# Patient Record
Sex: Male | Born: 1970 | Race: Black or African American | Hispanic: No | Marital: Married | Smoking: Never smoker
Health system: Southern US, Community
[De-identification: ages and names within clinical notes are randomized; demographics above are authoritative.]

## PROBLEM LIST (undated history)

## (undated) DIAGNOSIS — E119 Type 2 diabetes mellitus without complications: Secondary | ICD-10-CM

## (undated) DIAGNOSIS — I10 Essential (primary) hypertension: Secondary | ICD-10-CM

## (undated) DIAGNOSIS — M1990 Unspecified osteoarthritis, unspecified site: Secondary | ICD-10-CM

## (undated) HISTORY — PX: OTHER SURGICAL HISTORY: SHX169

---

## 2013-06-08 NOTE — Patient Instructions (Signed)
SI JACHIM  06/08/2013   Your procedure is scheduled on:  06/12/13               Surgery 1230pm-130pm  Report to [facility] at      1000AM.  Call this number if you have problems the morning of surgery: [phone]   Remember:   Do not eat food or drink liquids after midnight.   Take these medicines the morning of surgery with A SIP OF WATER:    Do not wear jewelry  Do not wear lotions, powders, or perfumes.    Men may shave face and neck.  Do not bring valuables to the hospital.  Contacts, dentures or bridgework may not be worn into surgery.       Patients discharged the day of surgery will not be allowed to drive  home.  Name and phone number of your driver:    SEE CHG INSTRUCTION SHEET    Please read over the following fact sheets that you were given: MRSA Information, coughing and deep breathing exercises, leg exercises               Failure to comply with these instructions may result in cancellation of your surgery.                Patient Signature ____________________________              Nurse Signature _____________________________

## 2013-06-11 ENCOUNTER — Encounter (HOSPITAL_COMMUNITY)
Admission: RE | Admit: 2013-06-11 | Discharge: 2013-06-11 | Disposition: A | Discharge status: Home / Self Care | Payer: Self-pay | Source: Ambulatory Visit | Attending: Orthopedic Surgery | Admitting: Orthopedic Surgery

## 2013-06-11 ENCOUNTER — Ambulatory Visit (HOSPITAL_COMMUNITY)
Admission: RE | Admit: 2013-06-11 | Discharge: 2013-06-11 | Disposition: A | Discharge status: Home / Self Care | Payer: Self-pay | Source: Ambulatory Visit | Attending: Orthopedic Surgery | Admitting: Orthopedic Surgery

## 2013-06-11 ENCOUNTER — Encounter (HOSPITAL_COMMUNITY): Payer: Self-pay

## 2013-06-11 ENCOUNTER — Encounter (HOSPITAL_COMMUNITY): Payer: Self-pay | Admitting: Pharmacy Technician

## 2013-06-11 DIAGNOSIS — I10 Essential (primary) hypertension: Secondary | ICD-10-CM | POA: Insufficient documentation

## 2013-06-11 DIAGNOSIS — Z01818 Encounter for other preprocedural examination: Secondary | ICD-10-CM | POA: Insufficient documentation

## 2013-06-11 DIAGNOSIS — Z01812 Encounter for preprocedural laboratory examination: Secondary | ICD-10-CM | POA: Insufficient documentation

## 2013-06-11 DIAGNOSIS — X58XXXA Exposure to other specified factors, initial encounter: Secondary | ICD-10-CM | POA: Insufficient documentation

## 2013-06-11 DIAGNOSIS — Z01810 Encounter for preprocedural cardiovascular examination: Secondary | ICD-10-CM | POA: Insufficient documentation

## 2013-06-11 DIAGNOSIS — IMO0002 Reserved for concepts with insufficient information to code with codable children: Secondary | ICD-10-CM | POA: Insufficient documentation

## 2013-06-11 HISTORY — DX: Essential (primary) hypertension: I10

## 2013-06-11 HISTORY — DX: Type 2 diabetes mellitus without complications: E11.9

## 2013-06-11 HISTORY — DX: Unspecified osteoarthritis, unspecified site: M19.90

## 2013-06-11 LAB — CBC
HCT: 38.8 % — ABNORMAL LOW (ref 39.0–52.0)
MCH: 28.9 pg (ref 26.0–34.0)
MCV: 87 fL (ref 78.0–100.0)
Platelets: 378 10*3/uL (ref 150–400)
RDW: 12.5 % (ref 11.5–15.5)
WBC: 12.4 10*3/uL — ABNORMAL HIGH (ref 4.0–10.5)

## 2013-06-11 LAB — BASIC METABOLIC PANEL
BUN: 13 mg/dL (ref 6–23)
CO2: 31 mEq/L (ref 19–32)
Calcium: 10.1 mg/dL (ref 8.4–10.5)
Chloride: 95 mEq/L — ABNORMAL LOW (ref 96–112)
Creatinine, Ser: 0.87 mg/dL (ref 0.50–1.35)
GFR calc Af Amer: >90 mL/min (ref >90)

## 2013-06-11 NOTE — Progress Notes (Signed)
BMP results faxed via EPIC to Dr Darrelyn Hillock.

## 2013-06-11 NOTE — Progress Notes (Signed)
CBC results faxed via EPIC to Dr Gioffre.  

## 2013-06-12 ENCOUNTER — Ambulatory Visit (HOSPITAL_COMMUNITY): Payer: Self-pay | Admitting: Anesthesiology

## 2013-06-12 ENCOUNTER — Encounter (HOSPITAL_COMMUNITY): Payer: Self-pay | Admitting: Anesthesiology

## 2013-06-12 ENCOUNTER — Encounter (HOSPITAL_COMMUNITY)
Admission: RE | Disposition: A | Discharge status: Home / Self Care | Payer: Self-pay | Source: Ambulatory Visit | Attending: Orthopedic Surgery

## 2013-06-12 ENCOUNTER — Encounter (HOSPITAL_COMMUNITY): Payer: Self-pay | Admitting: *Deleted

## 2013-06-12 ENCOUNTER — Ambulatory Visit (HOSPITAL_COMMUNITY)
Admission: RE | Admit: 2013-06-12 | Discharge: 2013-06-12 | Disposition: A | Discharge status: Home / Self Care | Payer: Self-pay | Source: Ambulatory Visit | Attending: Orthopedic Surgery | Admitting: Orthopedic Surgery

## 2013-06-12 DIAGNOSIS — I10 Essential (primary) hypertension: Secondary | ICD-10-CM | POA: Insufficient documentation

## 2013-06-12 DIAGNOSIS — Z79899 Other long term (current) drug therapy: Secondary | ICD-10-CM | POA: Insufficient documentation

## 2013-06-12 DIAGNOSIS — IMO0002 Reserved for concepts with insufficient information to code with codable children: Secondary | ICD-10-CM | POA: Insufficient documentation

## 2013-06-12 DIAGNOSIS — X58XXXA Exposure to other specified factors, initial encounter: Secondary | ICD-10-CM | POA: Insufficient documentation

## 2013-06-12 DIAGNOSIS — M6580 Other synovitis and tenosynovitis, unspecified site: Secondary | ICD-10-CM | POA: Insufficient documentation

## 2013-06-12 DIAGNOSIS — E119 Type 2 diabetes mellitus without complications: Secondary | ICD-10-CM | POA: Insufficient documentation

## 2013-06-12 DIAGNOSIS — M1710 Unilateral primary osteoarthritis, unspecified knee: Secondary | ICD-10-CM | POA: Insufficient documentation

## 2013-06-12 DIAGNOSIS — S83249A Other tear of medial meniscus, current injury, unspecified knee, initial encounter: Secondary | ICD-10-CM

## 2013-06-12 DIAGNOSIS — S83242A Other tear of medial meniscus, current injury, left knee, initial encounter: Secondary | ICD-10-CM

## 2013-06-12 HISTORY — PX: KNEE ARTHROSCOPY WITH MEDIAL MENISECTOMY: SHX5651

## 2013-06-12 LAB — GLUCOSE, CAPILLARY
Glucose-Capillary: 110 mg/dL — ABNORMAL HIGH (ref 70–99)
Glucose-Capillary: 105 mg/dL — ABNORMAL HIGH (ref 70–99)

## 2013-06-12 SURGERY — ARTHROSCOPY, KNEE, WITH MEDIAL MENISCECTOMY
Anesthesia: General | Site: Knee | Laterality: Left | Wound class: Clean

## 2013-06-12 MED ORDER — OXYCODONE-ACETAMINOPHEN 5-325 MG PO TABS
1.0000 | ORAL_TABLET | ORAL | Status: DC | PRN
  Administered 2013-06-12: 1 via ORAL
  Filled 2013-06-12: qty 1

## 2013-06-12 MED ORDER — FENTANYL CITRATE 0.05 MG/ML IJ SOLN
INTRAMUSCULAR | Status: DC | PRN
  Administered 2013-06-12 (×6): 25 ug via INTRAVENOUS

## 2013-06-12 MED ORDER — BACITRACIN ZINC 500 UNIT/GM EX OINT
TOPICAL_OINTMENT | CUTANEOUS | Status: DC | PRN
  Administered 2013-06-12: 1 via TOPICAL

## 2013-06-12 MED ORDER — MEPERIDINE HCL 50 MG/ML IJ SOLN: 6.2500 mg | INTRAMUSCULAR | Status: DC | PRN

## 2013-06-12 MED ORDER — FENTANYL CITRATE 0.05 MG/ML IJ SOLN: 25.0000 ug | INTRAMUSCULAR | Status: DC | PRN

## 2013-06-12 MED ORDER — BACITRACIN ZINC 500 UNIT/GM EX OINT
TOPICAL_OINTMENT | CUTANEOUS | Status: AC
  Filled 2013-06-12: qty 28.35

## 2013-06-12 MED ORDER — MIDAZOLAM HCL 5 MG/5ML IJ SOLN
INTRAMUSCULAR | Status: DC | PRN
  Administered 2013-06-12: 2 mg via INTRAVENOUS

## 2013-06-12 MED ORDER — BUPIVACAINE-EPINEPHRINE 0.25% -1:200000 IJ SOLN
INTRAMUSCULAR | Status: DC | PRN
  Administered 2013-06-12: 30 mL

## 2013-06-12 MED ORDER — BUPIVACAINE-EPINEPHRINE PF 0.25-1:200000 % IJ SOLN
INTRAMUSCULAR | Status: AC
  Filled 2013-06-12: qty 30

## 2013-06-12 MED ORDER — LACTATED RINGERS IR SOLN
Status: DC | PRN
  Administered 2013-06-12 (×3): 3000 mL

## 2013-06-12 MED ORDER — OXYCODONE-ACETAMINOPHEN 10-325 MG PO TABS: 1.0000 | ORAL_TABLET | ORAL | Status: AC | PRN

## 2013-06-12 MED ORDER — ONDANSETRON HCL 4 MG/2ML IJ SOLN
INTRAMUSCULAR | Status: DC | PRN
  Administered 2013-06-12: 4 mg via INTRAVENOUS

## 2013-06-12 MED ORDER — LACTATED RINGERS IV SOLN
INTRAVENOUS | Status: DC
  Administered 2013-06-12: 1000 mL via INTRAVENOUS

## 2013-06-12 MED ORDER — OXYCODONE HCL 5 MG PO TABS
5.0000 mg | ORAL_TABLET | ORAL | Status: DC | PRN
  Filled 2013-06-12: qty 1

## 2013-06-12 MED ORDER — OXYCODONE HCL 5 MG PO TABS
5.0000 mg | ORAL_TABLET | ORAL | Status: DC | PRN
  Administered 2013-06-12: 5 mg via ORAL

## 2013-06-12 MED ORDER — LACTATED RINGERS IV SOLN: INTRAVENOUS | Status: DC

## 2013-06-12 MED ORDER — ACETAMINOPHEN 325 MG PO TABS
325.0000 mg | ORAL_TABLET | ORAL | Status: DC | PRN
  Filled 2013-06-12: qty 1

## 2013-06-12 MED ORDER — PROPOFOL 10 MG/ML IV BOLUS
INTRAVENOUS | Status: DC | PRN
  Administered 2013-06-12: 200 mg via INTRAVENOUS
  Administered 2013-06-12: 50 mg via INTRAVENOUS

## 2013-06-12 MED ORDER — PROMETHAZINE HCL 25 MG/ML IJ SOLN: 6.2500 mg | INTRAMUSCULAR | Status: DC | PRN

## 2013-06-12 MED ORDER — CEFAZOLIN SODIUM-DEXTROSE 2-3 GM-% IV SOLR
2.0000 g | INTRAVENOUS | Status: AC
  Administered 2013-06-12: 2 g via INTRAVENOUS

## 2013-06-12 MED ORDER — CEFAZOLIN SODIUM-DEXTROSE 2-3 GM-% IV SOLR
INTRAVENOUS | Status: AC
  Filled 2013-06-12: qty 50

## 2013-06-12 MED ORDER — CHLORHEXIDINE GLUCONATE 4 % EX LIQD
60.0000 mL | Freq: Once | CUTANEOUS | Status: DC
  Filled 2013-06-12: qty 60

## 2013-06-12 SURGICAL SUPPLY — 29 items
BANDAGE ELASTIC 4 VELCRO ST LF (GAUZE/BANDAGES/DRESSINGS) ×2 IMPLANT
BANDAGE ELASTIC 6 VELCRO ST LF (GAUZE/BANDAGES/DRESSINGS) ×4 IMPLANT
BANDAGE GAUZE ELAST BULKY 4 IN (GAUZE/BANDAGES/DRESSINGS) ×2 IMPLANT
BLADE GREAT WHITE 4.2 (BLADE) ×2 IMPLANT
BNDG COHESIVE 6X5 TAN STRL LF (GAUZE/BANDAGES/DRESSINGS) ×2 IMPLANT
CLOTH BEACON ORANGE TIMEOUT ST (SAFETY) ×2 IMPLANT
DRAPE LG THREE QUARTER DISP (DRAPES) ×2 IMPLANT
DRSG ADAPTIC 3X8 NADH LF (GAUZE/BANDAGES/DRESSINGS) ×2 IMPLANT
DRSG EMULSION OIL 3X3 NADH (GAUZE/BANDAGES/DRESSINGS) ×2 IMPLANT
DRSG PAD ABDOMINAL 8X10 ST (GAUZE/BANDAGES/DRESSINGS) ×6 IMPLANT
DURAPREP 26ML APPLICATOR (WOUND CARE) ×2 IMPLANT
GLOVE BIOGEL PI IND STRL 8 (GLOVE) ×1 IMPLANT
GLOVE BIOGEL PI INDICATOR 8 (GLOVE) ×1
GLOVE ECLIPSE 8.0 STRL XLNG CF (GLOVE) ×2 IMPLANT
GOWN PREVENTION PLUS LG XLONG (DISPOSABLE) ×2 IMPLANT
GOWN STRL REIN XL XLG (GOWN DISPOSABLE) ×2 IMPLANT
MANIFOLD NEPTUNE II (INSTRUMENTS) ×2 IMPLANT
PACK ARTHROSCOPY WL (CUSTOM PROCEDURE TRAY) ×2 IMPLANT
PACK ICE MAXI GEL EZY WRAP (MISCELLANEOUS) ×2 IMPLANT
PAD MASON LEG HOLDER (PIN) ×2 IMPLANT
SET ARTHROSCOPY TUBING (MISCELLANEOUS) ×1
SET ARTHROSCOPY TUBING LN (MISCELLANEOUS) ×1 IMPLANT
SPONGE GAUZE 4X4 12PLY (GAUZE/BANDAGES/DRESSINGS) ×2 IMPLANT
SUT ETHILON 3 0 PS 1 (SUTURE) ×2 IMPLANT
SYR 20CC LL (SYRINGE) ×4 IMPLANT
TOWEL OR 17X26 10 PK STRL BLUE (TOWEL DISPOSABLE) ×2 IMPLANT
TUBING CONNECTING 10 (TUBING) ×2 IMPLANT
WAND 90 DEG TURBOVAC W/CORD (SURGICAL WAND) ×2 IMPLANT
WRAP KNEE MAXI GEL POST OP (GAUZE/BANDAGES/DRESSINGS) ×2 IMPLANT

## 2013-06-12 NOTE — Brief Op Note (Signed)
06/12/2013  1:50 PM  PATIENT:  Douglas Simmons  42 y.o. male  PRE-OPERATIVE DIAGNOSIS:  LEFT KNEE MEDIA MENISCAL TEAR  POST-OPERATIVE DIAGNOSIS:  LEFT KNEE MEDIA MENISCAL TEAR  PROCEDURE:  Procedure(s): LEFT KNEE ARTHROSCOPY WITH MEDIAL MENISECTOMY (Left) and Synovectomy of Suprapatellar Pouch and Abraision Chondroplasty of Medial Femoral Condyle.  SURGEON:  Surgeon(s) and Role:    * Jacki Cones, MD - Primary    ASSISTANTS:OR Tech   ANESTHESIA:   general  EBL:     BLOOD ADMINISTERED:none  DRAINS: none   LOCAL MEDICATIONS USED:  MARCAINE 30cc of 0.25% with Epinephrine.   SPECIMEN:  No Specimen  DISPOSITION OF SPECIMEN:  N/A  COUNTS:  YES  TOURNIQUET:  * No tourniquets in log *  DICTATION: .Other Dictation: Dictation Number [id number]  PLAN OF CARE: Discharge to home after PACU  PATIENT DISPOSITION:  PACU - hemodynamically stable.   Delay start of Pharmacological VTE agent (>24hrs) due to surgical blood loss or risk of bleeding: yes

## 2013-06-12 NOTE — Transfer of Care (Signed)
Immediate Anesthesia Transfer of Care Note  Patient: [name]  Procedure(s) Performed: Procedure(s) (LRB): LEFT KNEE ARTHROSCOPY WITH MEDIAL MENISECTOMY (Left)  Patient Location: PACU  Anesthesia Type: General  Level of Consciousness: sedated, patient cooperative and responds to stimulation  Airway & Oxygen Therapy: Patient Spontanous Breathing and Patient connected to face mask oxgen  Post-op Assessment: Report given to PACU RN and Post -op Vital signs reviewed and stable  Post vital signs: Reviewed and stable  Complications: No apparent anesthesia complications

## 2013-06-12 NOTE — Anesthesia Preprocedure Evaluation (Addendum)
Anesthesia Evaluation  Patient identified by MRN, date of birth, ID band Patient awake    Reviewed: Allergy & Precautions, H&P , NPO status , Patient's Chart, lab work & pertinent test results  Airway Mallampati: II TM Distance: >3 FB Neck ROM: full    Dental no notable dental hx.    Pulmonary neg pulmonary ROS,  breath sounds clear to auscultation  Pulmonary exam normal       Cardiovascular Exercise Tolerance: Good hypertension, On Medications Rhythm:regular Rate:Normal     Neuro/Psych negative neurological ROS  negative psych ROS   GI/Hepatic negative GI ROS, Neg liver ROS,   Endo/Other  diabetes, Well Controlled, Type 2, Oral Hypoglycemic Agents  Renal/GU negative Renal ROS  negative genitourinary   Musculoskeletal   Abdominal   Peds  Hematology negative hematology ROS (+)   Anesthesia Other Findings   Reproductive/Obstetrics negative OB ROS                           Anesthesia Physical Anesthesia Plan  ASA: III  Anesthesia Plan: General   Post-op Pain Management:    Induction: Intravenous  Airway Management Planned: LMA  Additional Equipment:   Intra-op Plan:   Post-operative Plan:   Informed Consent: I have reviewed the patients History and Physical, chart, labs and discussed the procedure including the risks, benefits and alternatives for the proposed anesthesia with the patient or authorized representative who has indicated his/her understanding and acceptance.   Dental Advisory Given  Plan Discussed with: CRNA and Surgeon  Anesthesia Plan Comments:         Anesthesia Quick Evaluation

## 2013-06-12 NOTE — Interval H&P Note (Signed)
History and Physical Interval Note:  06/12/2013 12:46 PM  Douglas Simmons  has presented today for surgery, with the diagnosis of LEFT KNEE MEDIA MENISCAL TEAR  The various methods of treatment have been discussed with the patient and family. After consideration of risks, benefits and other options for treatment, the patient has consented to  Procedure(s): LEFT KNEE ARTHROSCOPY WITH MEDIAL MENISECTOMY (Left) as a surgical intervention .  The patient's history has been reviewed, patient examined, no change in status, stable for surgery.  I have reviewed the patient's chart and labs.  Questions were answered to the patient's satisfaction.     Braedyn Kauk A

## 2013-06-12 NOTE — Progress Notes (Signed)
Patient and wife verbalized understanding of discharge instructions. Ortho Tech demonstrated use of crutches to patient. Patient utilized crutches to restroom prior to discharge. Patient was given prescriptions. Patient was accompanied by nursing staff to lobby. Patient is stable at discharge.

## 2013-06-12 NOTE — Anesthesia Postprocedure Evaluation (Signed)
  Anesthesia Post-op Note  Patient: Douglas Simmons  Procedure(s) Performed: Procedure(s) (LRB): LEFT KNEE ARTHROSCOPY WITH MEDIAL MENISECTOMY (Left)  Patient Location: PACU  Anesthesia Type: General  Level of Consciousness: awake and alert   Airway and Oxygen Therapy: Patient Spontanous Breathing  Post-op Pain: mild  Post-op Assessment: Post-op Vital signs reviewed, Patient's Cardiovascular Status Stable, Respiratory Function Stable, Patent Airway and No signs of Nausea or vomiting  Last Vitals:  Filed Vitals:   06/12/13 1454  BP: 140/93  Pulse: 70  Temp: 36.7 C  Resp: 14    Post-op Vital Signs: stable   Complications: No apparent anesthesia complications

## 2013-06-12 NOTE — H&P (Signed)
Douglas Simmons is an 42 y.o. male.   Chief Complaint: left knee pain HPI: The patient is a 42 year old male who presented with the chief complaint of left knee pain. He did not recall a specific fall but felt that he had injured his knee while moving some furniture 2 months ago. He has had pain and swelling, as well as mechanical symptoms including locking and catching. MRI of the left knee showed tricompartmental arthritis as well as a torn medial meniscus.  Past Medical History  Diagnosis Date  . Hypertension   . Diabetes mellitus without complication   . Arthritis     Past Surgical History  Procedure Laterality Date  . Right knee arthroscopy       Social History:  reports that he has never smoked. He has never used smokeless tobacco. He reports that he does not drink alcohol or use illicit drugs.  Allergies: No Known Allergies  Current outpatient prescriptions: amLODipine (NORVASC) 10 MG tablet, Take 10 mg by mouth daily., Disp: , Rfl: ;   candesartan (ATACAND) 32 MG tablet, Take 32 mg by mouth daily., Disp: , Rfl: ;   glipiZIDE (GLUCOTROL) 5 MG tablet, Take 5 mg by mouth 2 (two) times daily before a meal., Disp: , Rfl: ;   hydrochlorothiazide (HYDRODIURIL) 25 MG tablet, Take 25 mg by mouth daily., Disp: , Rfl:  metFORMIN (GLUCOPHAGE) 500 MG tablet, Take 1,000 mg by mouth 2 (two) times daily with a meal., Disp: , Rfl: ;   simvastatin (ZOCOR) 10 MG tablet, Take 10 mg by mouth at bedtime., Disp: , Rfl:   Results for orders placed during the hospital encounter of 06/11/13 (from the past 48 hour(s))  CBC     Status: Abnormal   Collection Time    06/11/13  8:20 AM      Result Value Range   WBC 12.4 (*) 4.0 - 10.5 K/uL   RBC 4.46  4.22 - 5.81 MIL/uL   Hemoglobin 12.9 (*) 13.0 - 17.0 g/dL   HCT 11.9 (*) 14.7 - 82.9 %   MCV 87.0  78.0 - 100.0 fL   MCH 28.9  26.0 - 34.0 pg   MCHC 33.2  30.0 - 36.0 g/dL   RDW 56.2  13.0 - 86.5 %   Platelets 378  150 - 400 K/uL  BASIC METABOLIC  PANEL     Status: Abnormal   Collection Time    06/11/13  8:20 AM      Result Value Range   Sodium 137  135 - 145 mEq/L   Potassium 3.2 (*) 3.5 - 5.1 mEq/L   Chloride 95 (*) 96 - 112 mEq/L   CO2 31  19 - 32 mEq/L   Glucose, Bld 134 (*) 70 - 99 mg/dL   BUN 13  6 - 23 mg/dL   Creatinine, Ser 7.84  0.50 - 1.35 mg/dL   Calcium 69.6  8.4 - 29.5 mg/dL   GFR calc non Af Amer >90  >90 mL/min   GFR calc Af Amer >90  >90 mL/min   Comment: (NOTE)     The eGFR has been calculated using the CKD EPI equation.     This calculation has not been validated in all clinical situations.     eGFR's persistently <90 mL/min signify possible Chronic Kidney     Disease.   Dg Chest 2 View  06/11/2013   CLINICAL DATA:  Preop for left knee surgery  EXAM: CHEST  2 VIEW  COMPARISON:  None.  FINDINGS: The heart size and mediastinal contours are within normal limits. No acute infiltrate or pulmonary edema. The visualized skeletal structures are unremarkable.  IMPRESSION: No active cardiopulmonary disease.   Electronically Signed   By: Natasha Mead   On: 06/11/2013 08:40    Review of Systems  Constitutional: Negative.   HENT: Negative.  Negative for neck pain.   Eyes: Negative.   Respiratory: Negative.   Cardiovascular: Negative.   Gastrointestinal: Negative.   Genitourinary: Positive for frequency. Negative for dysuria, urgency, hematuria and flank pain.  Musculoskeletal: Positive for joint pain. Negative for myalgias, back pain and falls.  Skin: Negative.   Neurological: Negative.   Endo/Heme/Allergies: Negative.   Psychiatric/Behavioral: Negative.    Vitals Weight: 250 lb Height: 72 in Body Surface Area: 2.4 m Body Mass Index: 33.91 kg/m BP: 134/91 (Sitting, Left Arm, Standard)  HR: 76  Physical Exam  Constitutional: He is oriented to person, place, and time. He appears well-developed and well-nourished. No distress.  HENT:  Head: Normocephalic and atraumatic.  Right Ear: External ear  normal.  Left Ear: External ear normal.  Nose: Nose normal.  Mouth/Throat: Oropharynx is clear and moist.  Eyes: Conjunctivae and EOM are normal.  Neck: Normal range of motion. Neck supple.  Cardiovascular: Normal rate, regular rhythm, normal heart sounds and intact distal pulses.   No murmur heard. Respiratory: Effort normal and breath sounds normal. No respiratory distress. He has no wheezes.  GI: Soft. Bowel sounds are normal. He exhibits no distension. There is no tenderness.  Musculoskeletal:       Right hip: Normal.       Left hip: Normal.       Right knee: Normal.       Left knee: He exhibits decreased range of motion, swelling, effusion and abnormal meniscus. He exhibits no erythema. Tenderness found. Medial joint line tenderness noted. No lateral joint line tenderness noted.       Right lower leg: He exhibits no tenderness and no swelling.       Left lower leg: He exhibits no tenderness and no swelling.  Neurological: He is alert and oriented to person, place, and time. He has normal strength and normal reflexes. No sensory deficit.  Skin: No rash noted. He is not diaphoretic. No erythema.  Psychiatric: He has a normal mood and affect. His behavior is normal.     Assessment/Plan Medial meniscus tear, left knee He has a medial meniscus tear in his left knee. He needs a left knee arthroscopy with medial menisectomy. This will be an outpatient procedure. Risks and benefits of the surgery were discussed with the patient by Dr. Darrelyn Hillock.   Elijan Googe LAUREN 06/12/2013, 8:31 AM

## 2013-06-13 ENCOUNTER — Encounter (HOSPITAL_COMMUNITY): Payer: Self-pay | Admitting: Orthopedic Surgery

## 2013-06-13 NOTE — Op Note (Signed)
NAMESAFI, CULOTTA NO.:  [id number]  MEDICAL RECORD NO.:  [id number]  LOCATION:  [facility]                         FACILITY:  [facility]  PHYSICIAN:  Georges Lynch. Catalena Stanhope, M.D.DATE OF BIRTH:  Jul 03, 1971  DATE OF PROCEDURE:  06/12/2013 DATE OF DISCHARGE:  06/12/2013                              OPERATIVE REPORT   SURGEON:  Windy Fast A. [place] Inthavong, MD  OPERATIVE ASSISTANT:  The OR tech.  PREOPERATIVE DIAGNOSES: 1. Complex tear of the posterior horn medial meniscus of the left     knee. 2. Chronic synovitis, left knee.  POSTOPERATIVE DIAGNOSES: 1. Complex tear of the posterior horn medial meniscus of the left     knee. 2.  Chronic synovitis, left knee. 2. Early arthritis, left knee.  DESCRIPTION OF PROCEDURE:  Under general anesthesia, a routine orthopedic prepping and draping of the left lower extremity was carried out.  The left leg was placed in a knee holder.  The appropriate time- out was first carried out.  I also marked the appropriate left leg in the holding area.  At this time, a small punctate incision was made in the suprapatellar pouch after 2 g of IV Ancef was given.  I then inserted the inflow cannula.  The knee was distended with saline. Another small punctate incision was made in the anterolateral joint. The arthroscope was entered from lateral approach and a complete diagnostic arthroscopy was carried out.  I went up in the suprapatellar pouch.  He had a moderate synovitis.  I introduced the ArthroCare through the medial approach, did a partial synovectomy.  The patellofemoral joint looked fine.  I went down to the lateral joint. The lateral meniscus was intact.  The lateral joint looked fine. Cruciates were intact.  I went over the medial joint, highlighted main problem was the early chondromalacia of the medial joint.  I did a very minimal abrasion chondroplasty of the medial femoral condyle and we did not do this to bleeding bone.  I went back  posterior.  He had a large complex tear of the posterior horn of the medial meniscus.  I introduced the ArthroCare and did a partial medial meniscectomy.  Remaining part of the meniscus was stable.  I thoroughly irrigated out the knee, removed the fluid.  I closed all 3 punctate incisions with 3-0 nylon suture.  I injected 30 mL of  0.25% Marcaine with epinephrine in the knee joint and a sterile Neosporin dressing was applied.  The patient left the operating room in satisfactory condition.  POSTOPERATIVE CARE: 1. The patient will be on aspirin 325 mg b.i.d. as an anticoagulant,     starting today, will be on Percocet 10/350 one     every 3 to 4 hours p.r.n. for pain. 2. He will be on crutches partial-to-full weightbearing as tolerated. 3. He will see me in the office in 10 to 12 days or prior to that if     there is any problem.          ______________________________ Georges Lynch Darrelyn Hillock, M.D.     RAG/MEDQ  D:  06/12/2013  T:  06/13/2013  Job:  [id number]

## 2017-11-27 ENCOUNTER — Emergency Department (HOSPITAL_COMMUNITY): Payer: Self-pay

## 2017-11-27 ENCOUNTER — Emergency Department (HOSPITAL_COMMUNITY)
Admission: EM | Admit: 2017-11-27 | Discharge: 2017-11-27 | Disposition: A | Discharge status: Home / Self Care | Payer: Self-pay | Attending: Emergency Medicine | Admitting: Emergency Medicine

## 2017-11-27 DIAGNOSIS — R55 Syncope and collapse: Secondary | ICD-10-CM | POA: Insufficient documentation

## 2017-11-27 DIAGNOSIS — Z79899 Other long term (current) drug therapy: Secondary | ICD-10-CM | POA: Insufficient documentation

## 2017-11-27 DIAGNOSIS — E119 Type 2 diabetes mellitus without complications: Secondary | ICD-10-CM | POA: Insufficient documentation

## 2017-11-27 DIAGNOSIS — Z95810 Presence of automatic (implantable) cardiac defibrillator: Secondary | ICD-10-CM | POA: Insufficient documentation

## 2017-11-27 DIAGNOSIS — Z7984 Long term (current) use of oral hypoglycemic drugs: Secondary | ICD-10-CM | POA: Insufficient documentation

## 2017-11-27 DIAGNOSIS — I10 Essential (primary) hypertension: Secondary | ICD-10-CM | POA: Insufficient documentation

## 2017-11-27 LAB — CBC WITH DIFFERENTIAL/PLATELET
BASOS ABS: 0 10*3/uL (ref 0.0–0.1)
BASOS PCT: 1 %
EOS ABS: 0.2 10*3/uL (ref 0.0–0.7)
EOS PCT: 4 %
HCT: 39.1 % (ref 39.0–52.0)
HEMOGLOBIN: 12.8 g/dL — AB (ref 13.0–17.0)
LYMPHS ABS: 2.3 10*3/uL (ref 0.7–4.0)
Lymphocytes Relative: 41 %
MCH: 30.1 pg (ref 26.0–34.0)
MCHC: 32.7 g/dL (ref 30.0–36.0)
MCV: 92 fL (ref 78.0–100.0)
Monocytes Absolute: 0.3 10*3/uL (ref 0.1–1.0)
Monocytes Relative: 4 %
NEUTROS PCT: 50 %
Neutro Abs: 2.8 10*3/uL (ref 1.7–7.7)
PLATELETS: 234 10*3/uL (ref 150–400)
RBC: 4.25 MIL/uL (ref 4.22–5.81)
RDW: 12.6 % (ref 11.5–15.5)
WBC: 5.7 10*3/uL (ref 4.0–10.5)

## 2017-11-27 LAB — COMPREHENSIVE METABOLIC PANEL
ALBUMIN: 3.6 g/dL (ref 3.5–5.0)
ALK PHOS: 45 U/L (ref 38–126)
ALT: 13 U/L — AB (ref 17–63)
AST: 16 U/L (ref 15–41)
Anion gap: 9 (ref 5–15)
BUN: 12 mg/dL (ref 6–20)
CALCIUM: 8.8 mg/dL — AB (ref 8.9–10.3)
CHLORIDE: 104 mmol/L (ref 101–111)
CO2: 23 mmol/L (ref 22–32)
CREATININE: 1.14 mg/dL (ref 0.61–1.24)
GFR calc Af Amer: >60 mL/min (ref >60)
GFR calc non Af Amer: >60 mL/min (ref >60)
GLUCOSE: 243 mg/dL — AB (ref 65–99)
Potassium: 3.8 mmol/L (ref 3.5–5.1)
SODIUM: 136 mmol/L (ref 135–145)
Total Bilirubin: 0.9 mg/dL (ref 0.3–1.2)
Total Protein: 7.1 g/dL (ref 6.5–8.1)

## 2017-11-27 LAB — URINALYSIS, ROUTINE W REFLEX MICROSCOPIC
Bacteria, UA: NONE SEEN
Bilirubin Urine: NEGATIVE
Glucose, UA: 150 mg/dL — AB
HGB URINE DIPSTICK: NEGATIVE
Ketones, ur: NEGATIVE mg/dL
LEUKOCYTES UA: NEGATIVE
Nitrite: NEGATIVE
Protein, ur: 30 mg/dL — AB
SPECIFIC GRAVITY, URINE: 1.011 (ref 1.005–1.030)
pH: 6 (ref 5.0–8.0)

## 2017-11-27 LAB — CBG MONITORING, ED: GLUCOSE-CAPILLARY: 225 mg/dL — AB (ref 65–99)

## 2017-11-27 LAB — I-STAT TROPONIN, ED
Troponin i, poc: 0.05 ng/mL (ref 0.00–0.08)
Troponin i, poc: 0.04 ng/mL (ref 0.00–0.08)

## 2017-11-27 MED ORDER — SODIUM CHLORIDE 0.9 % IV BOLUS (SEPSIS)
250.0000 mL | Freq: Once | INTRAVENOUS | Status: AC
  Administered 2017-11-27: 250 mL via INTRAVENOUS

## 2017-11-27 MED ORDER — SODIUM CHLORIDE 0.9 % IV BOLUS (SEPSIS)
250.0000 mL | Freq: Once | INTRAVENOUS | Status: AC
Start: 2017-11-27 — End: 2017-11-27
  Administered 2017-11-27: 250 mL via INTRAVENOUS

## 2017-11-27 NOTE — ED Notes (Signed)
Pt and EDP updated to delay. [employer] tech on the way to the ED at this time

## 2017-11-27 NOTE — ED Notes (Signed)
Pt. returned from XR. 

## 2017-11-27 NOTE — Discharge Instructions (Signed)
Take home medications as prescribed.  Drink plenty of fluids and get plenty of rest.  Follow-up with your cardiologist for reevaluation of your symptoms.  Also follow-up with your primary care physician for reevaluation of your higher blood sugars.  Return to the emergency department immediately for any concerning signs or symptoms develop such as passing out, chest pain, shortness of breath, or weakness.

## 2017-11-27 NOTE — ED Notes (Signed)
EDP at bedside. Pt ambulatory to restroom with steady gait

## 2017-11-27 NOTE — ED Notes (Addendum)
[employer] called by this RN and [facility]Brianna [facility] to come interrogate pt pacemaker

## 2017-11-27 NOTE — ED Provider Notes (Signed)
[facility] EMERGENCY DEPARTMENT Provider Note   CSN: [id number] Arrival date & time: 11/27/17  1009     History   Chief Complaint Chief Complaint  Patient presents with  . AICD Problem  . Loss of Consciousness    HPI Douglas Simmons is a 47 y.o. male with history of arthritis, DM, HTN, dilated cardiomyopathy status post ICD implantation in August 2018 presents today for evaluation of acute onset, somewhat improving episode of lightheadedness.  He states that at around 9:30 AM this morning he was sitting in Bible study when he felt acutely lightheaded and became diaphoretic.  He is unsure if he lost consciousness but states that he was "in and out "and does not remember everything in the events leading up to his presentation to the ED.  Per family, no head injury.  He notes that he did not have breakfast this morning.  He denies chest pain or shortness of breath.  He does not think his ICD fired.  On EMS arrival, blood pressure was initially 90 systolic.  He states that he feels lethargic.  No recent travel or surgeries, no hemoptysis, no prior history of DVT or PE, and he is not on testosterone replacement therapy.  He states that his blood pressure has been running lower for an unknown amount of time and states that he will occasionally have episodes of lightheadedness but never to this extent.    The history is provided by the patient.    Past Medical History:  Diagnosis Date  . Arthritis   . Diabetes mellitus without complication   . Hypertension     Patient Active Problem List   Diagnosis Date Noted  . Acute medial meniscus tear 06/12/2013    Past Surgical History:  Procedure Laterality Date  . KNEE ARTHROSCOPY WITH MEDIAL MENISECTOMY Left 06/12/2013   Procedure: LEFT KNEE ARTHROSCOPY WITH MEDIAL MENISECTOMY;  Surgeon: Jacki Cones, MD;  Location: [facility];  Service: Orthopedics;  Laterality: Left;  . right knee arthroscopy          Home  Medications    Prior to Admission medications   Medication Sig Start Date End Date Taking? Authorizing Provider  chlorthalidone (HYGROTON) 25 MG tablet Take 25 mg by mouth daily. 09/05/17  Yes [provider]  furosemide (LASIX) 40 MG tablet Take 40 mg by mouth daily. 02/24/17  Yes [provider]  hydrALAZINE (APRESOLINE) 100 MG tablet Take 100 mg by mouth daily. 04/27/17  Yes [provider]  hydrochlorothiazide (HYDRODIURIL) 25 MG tablet Take 25 mg by mouth daily.   Yes [provider]  lisinopril (PRINIVIL,ZESTRIL) 40 MG tablet Take 40 mg by mouth daily. 08/31/17  Yes [provider]  lovastatin (MEVACOR) 20 MG tablet Take 20 mg by mouth daily. 05/03/17  Yes [provider]  metFORMIN (GLUCOPHAGE) 500 MG tablet Take 1,000 mg by mouth 2 (two) times daily with a meal.   Yes [provider]  simvastatin (ZOCOR) 10 MG tablet Take 10 mg by mouth at bedtime.   Yes [provider]  spironolactone (ALDACTONE) 25 MG tablet Take 25 mg by mouth daily. 11/08/17  Yes [provider]  candesartan (ATACAND) 32 MG tablet Take 32 mg by mouth daily.    [provider]  oxyCODONE-acetaminophen (PERCOCET) 10-325 MG per tablet Take 1 tablet by mouth every 4 (four) hours as needed for pain. Patient not taking: Reported on 11/27/2017 06/12/13   Ranee Gosselin, MD    Family History  No family history on file.  Social History Social History   Tobacco Use  . Smoking status: Never Smoker  . Smokeless tobacco: Never Used  Substance Use Topics  . Alcohol use: No  . Drug use: No     Allergies   Shellfish allergy   Review of Systems Review of Systems  Constitutional: Positive for diaphoresis and fatigue. Negative for chills and fever.  Eyes: Positive for photophobia. Negative for visual disturbance.  Respiratory: Negative for shortness of breath.   Cardiovascular: Negative for chest pain.  Gastrointestinal: Negative for  abdominal pain, nausea and vomiting.  Neurological: Positive for syncope. Negative for weakness, numbness and headaches.  All other systems reviewed and are negative.    Physical Exam Updated Vital Signs BP 130/80   Pulse 72   Temp 98 F (36.7 C)   Resp (!) 21   SpO2 95%   Physical Exam  Constitutional: He is oriented to person, place, and time. He appears well-developed and well-nourished. No distress.  HENT:  Head: Normocephalic and atraumatic.  No Battle's signs, no raccoon's eyes, no rhinorrhea. No hemotympanum. No tenderness to palpation of the face or skull. No deformity, crepitus, or swelling noted.   Eyes: Conjunctivae and EOM are normal. Pupils are equal, round, and reactive to light. Right eye exhibits no discharge. Left eye exhibits no discharge.  Neck: Normal range of motion. Neck supple. No JVD present. No tracheal deviation present.  Cardiovascular: Regular rhythm.  Pulmonary/Chest: Effort normal and breath sounds normal. No stridor. No respiratory distress. He has no wheezes. He has no rales. He exhibits no tenderness.  Well-healed surgical incision from pacemaker placement, equal rise and fall of chest, no increased work of breathing.  Abdominal: Soft. Bowel sounds are normal. He exhibits no distension. There is no tenderness. There is no guarding.  Musculoskeletal: He exhibits no edema.  Neurological: He is alert and oriented to person, place, and time.  Resting with his eyes closed but easily arousable and answers all questions appropriately.  Follows commands without difficulty.  Oriented to person place and time.  Speech is fluent with no evidence of dysarthria or aphasia.  No facial droop. Able to follow 2 step commands without difficulty.  Cranial Nerves: Patient is just changed 25 CT without contrast II:  Peripheral visual fields grossly normal, pupils equal, round, reactive to light III,IV, VI: ptosis not present, extra-ocular motions intact bilaterally    V,VII: smile symmetric, facial light touch sensation equal VIII: hearing grossly normal to voice  X: uvula elevates symmetrically  XI: bilateral shoulder shrug symmetric and strong XII: midline tongue extension without fassiculations Motor:  Normal tone. 5/5 strength of BUE and BLE major muscle groups including strong and equal grip strength and dorsiflexion/plantar flexion Sensory: light touch normal in all extremities. Cerebellar: normal finger-to-nose with bilateral upper extremities Gait: normal gait and balance. Able to walk on toes and heels with ease.   Skin: Skin is warm and dry. No erythema.  Psychiatric: He has a normal mood and affect. His behavior is normal.  Nursing note and vitals reviewed.    ED Treatments / Results  Labs (all labs ordered are listed, but only abnormal results are displayed) Labs Reviewed  URINALYSIS, ROUTINE W REFLEX MICROSCOPIC - Abnormal; Notable for the following components:      Result Value   Glucose, UA 150 (*)    Protein, ur 30 (*)    Squamous Epithelial / LPF 0-5 (*)    All other components within normal limits  CBC WITH DIFFERENTIAL/PLATELET - Abnormal; Notable for the following components:   Hemoglobin 12.8 (*)    All other components within normal limits  COMPREHENSIVE METABOLIC PANEL - Abnormal; Notable for the following components:   Glucose, Bld 243 (*)    Calcium 8.8 (*)    ALT 13 (*)    All other components within normal limits  CBG MONITORING, ED - Abnormal; Notable for the following components:   Glucose-Capillary 225 (*)    All other components within normal limits  I-STAT TROPONIN, ED  I-STAT TROPONIN, ED    EKG  EKG Interpretation  Date/Time:  Sunday November 27 2017 10:22:34 EDT Ventricular Rate:  65 PR Interval:    QRS Duration: 122 QT Interval:  454 QTC Calculation: 473 R Axis:   47 Text Interpretation:  Sinus rhythm Biatrial enlargement Left ventricular hypertrophy Abnormal T, consider ischemia, diffuse leads  Confirmed by Vanetta Mulders [phone]) on 11/27/2017 2:38:05 PM       Radiology Dg Chest 2 View  Result Date: 11/27/2017 CLINICAL DATA:  Shortness of Breath EXAM: CHEST - 2 VIEW COMPARISON:  04/26/2017 FINDINGS: Left pacer remains in place, unchanged. Cardiomegaly. No focal airspace opacity, effusion or edema. No acute bony abnormality. IMPRESSION: Cardiomegaly.  No active disease. Electronically Signed   By: Charlett Nose M.D.   On: 11/27/2017 11:46    Procedures Procedures (including critical care time)  Medications Ordered in ED Medications  sodium chloride 0.9 % bolus 250 mL (0 mLs Intravenous Stopped 11/27/17 1146)  sodium chloride 0.9 % bolus 250 mL (0 mLs Intravenous Stopped 11/27/17 1415)     Initial Impression / Assessment and Plan / ED Course  I have reviewed the triage vital signs and the nursing notes.  Pertinent labs & imaging results that were available during my care of the patient were reviewed by me and considered in my medical decision making (see chart for details).     Patient presents to the ED for evaluation of near syncopal episode which involved diaphoresis and lightheadedness.  He is afebrile, vital signs are stable.  He is nontoxic in appearance.  Initially he was a little sleepy but easily arousable.  He answers questions without difficulty and followed commands.  No focal neurologic deficits.  No leukocytosis, no anemia, no significant electrolyte abnormalities.  He is hyperglycemic in the ED today.  UA not concerning for UTI.  Serial troponins are negative, EKG shows nonspecific T wave inversions but is not suggestive of ischemia.  Chest x-ray shows cardiomegaly but no acute cardiopulmonary abnormalities.  Holly with [employer] that interrogated the patient's pacemaker which showed it did not go off and that his heart rate was within normal limits over the past 48 hours.  On reevaluation, patient is resting comfortably in no apparent distress.  He is tolerating p.o.  food and fluids without difficulty.  He is ambulatory without difficulty.  He exhibits a steady gait and good balance.  He is able to walk on his heels and toes without difficulty.  No lightheadedness or syncope.  He states he is feeling much better.  I doubt PE in the absence of shortness of breath, tachycardia, hypoxia, or tachypnea.  He does not appear to be in fulminant heart failure.  Doubt CVA or other intracranial abnormality.  Does not appear to be in DKA.  No evidence of pericarditis, bronchitis, pneumonia, pleural effusion, or dissection.  No further emergent workup required at this time.  He is stable for discharge home with follow-up with his  cardiologist for reevaluation of his symptoms as well as his primary care physician for reevaluation of his hyperglycemia.  Discussed indications for return to the ED. Pt and patient's wife verbalized understanding of and agreement with plan and patient is safe for discharge home at this time.   Final Clinical Impressions(s) / ED Diagnoses   Final diagnoses:  Near syncope    ED Discharge Orders    None       Bennye AlmFawze, Azha Constantin A, PA-C 11/27/17 Rebbeca [id number]    Zackowski, Scott, MD 11/30/17 2237

## 2017-11-27 NOTE — ED Notes (Signed)
Pt CBG 225, notified Tobi Bastosnna Banker(RN)

## 2017-11-27 NOTE — ED Triage Notes (Addendum)
Per EMS- pt was sitting at bible study when he slumped over in his chair. Pt has a [employer] ICD and it did not go off. Pt was diaphoretic. Is a diabetic, did not eat breakfast. CBG was 190 and repeat was 200s. BP originally was 90s systolic. 20G placed to L hand, no fluids given. Alert and oriented X4.  Upon assessment pt is lethargic and diaphoretic. Hx of heart failure reported by wife. No neuro deficits currently.

## 2017-11-27 NOTE — ED Notes (Signed)
[facility] [employer] [facility] [facility] [place]

## 2019-07-09 IMAGING — DX DG CHEST 2V
2 series · 2 of 2 positions shown · non-contrast
Comparison: 04/26/2017

CLINICAL DATA: Shortness of Breath

EXAM:
CHEST - 2 VIEW

[chest lat]
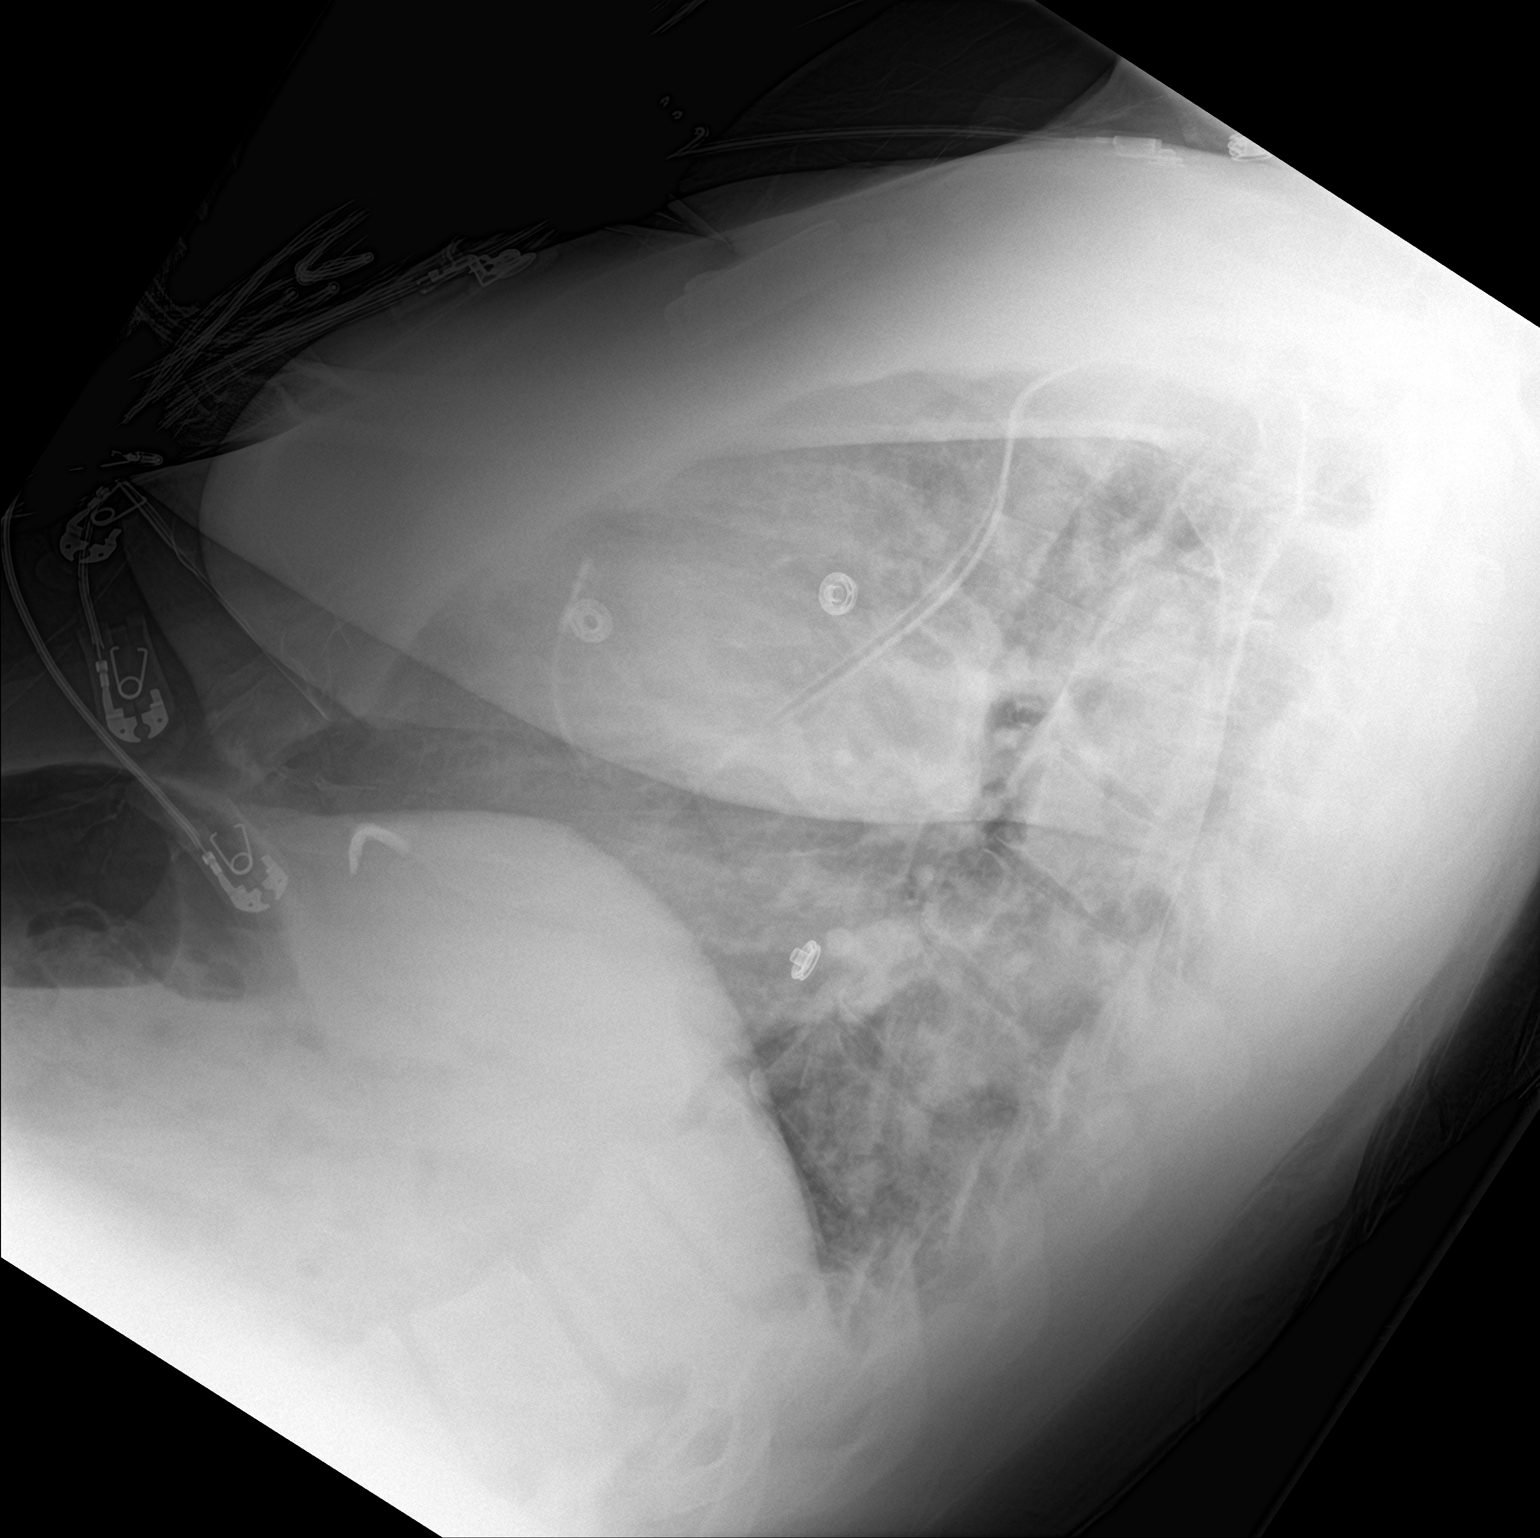

[chest ap]
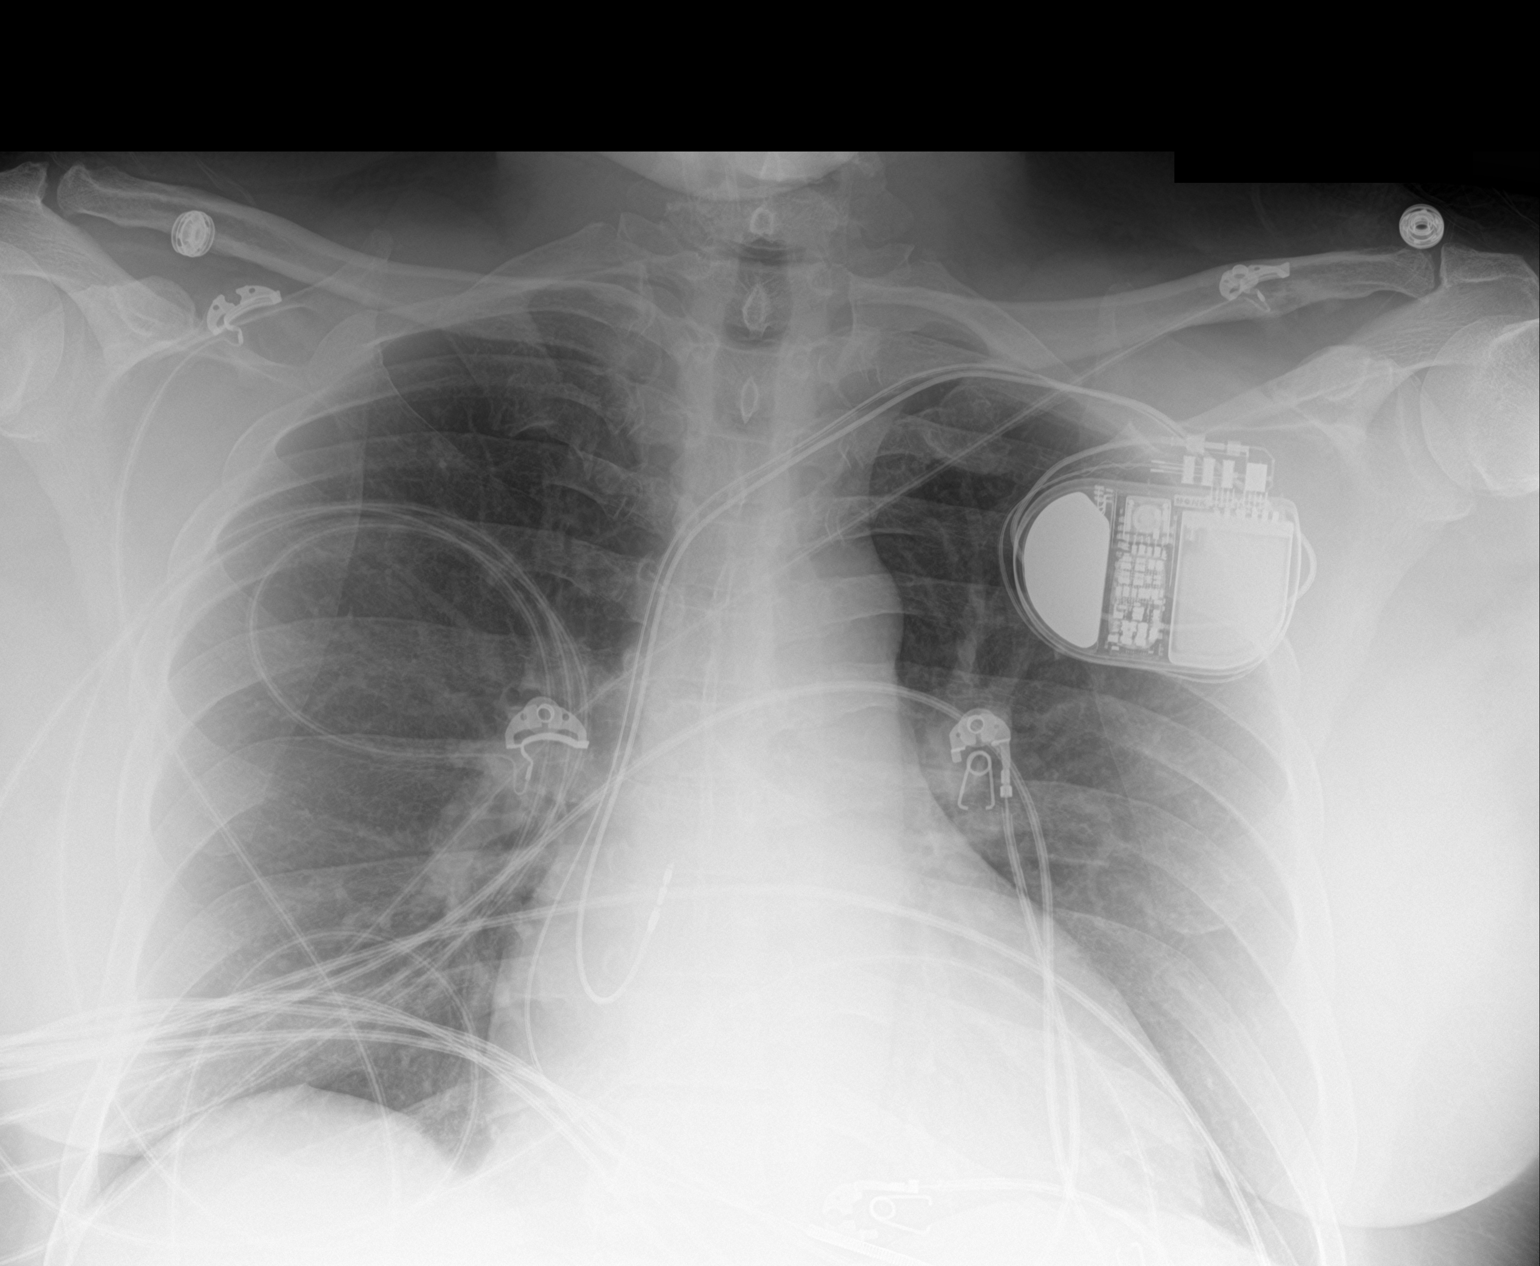

[2 of 2 positions shown; findings below may reference images not displayed]

FINDINGS: Left pacer remains in place, unchanged. Cardiomegaly. No focal
airspace opacity, effusion or edema. No acute bony abnormality.
IMPRESSION: Cardiomegaly.  No active disease.
